# Patient Record
Sex: Female | Born: 1960 | Hispanic: No | Marital: Married | Smoking: Never smoker
Health system: Southern US, Community
[De-identification: ages and names within clinical notes are randomized; demographics above are authoritative.]

## PROBLEM LIST (undated history)

## (undated) DIAGNOSIS — K219 Gastro-esophageal reflux disease without esophagitis: Secondary | ICD-10-CM

## (undated) HISTORY — DX: Gastro-esophageal reflux disease without esophagitis: K21.9

## (undated) HISTORY — PX: APPENDECTOMY: SHX54

---

## 1999-03-14 ENCOUNTER — Other Ambulatory Visit: Admission: RE | Admit: 1999-03-14 | Discharge: 1999-03-14 | Payer: Self-pay | Admitting: Obstetrics & Gynecology

## 2000-05-10 ENCOUNTER — Other Ambulatory Visit: Admission: RE | Admit: 2000-05-10 | Discharge: 2000-05-10 | Payer: Self-pay | Admitting: Obstetrics & Gynecology

## 2001-06-25 ENCOUNTER — Other Ambulatory Visit: Admission: RE | Admit: 2001-06-25 | Discharge: 2001-06-25 | Payer: Self-pay | Admitting: Obstetrics & Gynecology

## 2002-08-25 ENCOUNTER — Other Ambulatory Visit: Admission: RE | Admit: 2002-08-25 | Discharge: 2002-08-25 | Payer: Self-pay | Admitting: Obstetrics & Gynecology

## 2003-08-31 ENCOUNTER — Other Ambulatory Visit: Admission: RE | Admit: 2003-08-31 | Discharge: 2003-08-31 | Payer: Self-pay | Admitting: Obstetrics & Gynecology

## 2004-08-30 ENCOUNTER — Other Ambulatory Visit: Admission: RE | Admit: 2004-08-30 | Discharge: 2004-08-30 | Payer: Self-pay | Admitting: Obstetrics & Gynecology

## 2005-12-26 ENCOUNTER — Ambulatory Visit: Payer: Self-pay | Admitting: Family Medicine

## 2010-11-24 ENCOUNTER — Encounter: Payer: Self-pay | Admitting: Internal Medicine

## 2010-11-27 ENCOUNTER — Other Ambulatory Visit: Payer: Self-pay | Admitting: Gastroenterology

## 2010-11-28 ENCOUNTER — Ambulatory Visit: Payer: Self-pay | Admitting: Internal Medicine

## 2010-12-07 ENCOUNTER — Encounter: Payer: Self-pay | Admitting: Internal Medicine

## 2010-12-18 ENCOUNTER — Encounter: Payer: Self-pay | Admitting: Internal Medicine

## 2010-12-18 ENCOUNTER — Ambulatory Visit (INDEPENDENT_AMBULATORY_CARE_PROVIDER_SITE_OTHER): Payer: 59 | Admitting: Internal Medicine

## 2010-12-18 DIAGNOSIS — J309 Allergic rhinitis, unspecified: Secondary | ICD-10-CM | POA: Insufficient documentation

## 2010-12-18 DIAGNOSIS — K219 Gastro-esophageal reflux disease without esophagitis: Secondary | ICD-10-CM

## 2010-12-18 DIAGNOSIS — Z1211 Encounter for screening for malignant neoplasm of colon: Secondary | ICD-10-CM

## 2010-12-18 DIAGNOSIS — R195 Other fecal abnormalities: Secondary | ICD-10-CM

## 2010-12-18 MED ORDER — PEG-KCL-NACL-NASULF-NA ASC-C 100 G PO SOLR: 1.0000 | Freq: Once | ORAL | Status: AC

## 2010-12-18 NOTE — Progress Notes (Signed)
Subjective:    Patient ID: Kellie Alexander, female    DOB: 06-09-1960, 50 y.o.   MRN: [id number]  HPI Kellie Alexander is a 50 year old female with a past medical history of allergic rhinitis and GERD who is seen in consultation at the request of Dr. Christell Constant for evaluation of diarrhea and heme positive stool.  The patient reports developing a diarrheal illness in mid October 2012. This lasted for the better part of 7 days. During this time she had multiple loose, watery stools a day. This also occurred at night. She also reports rectal pain and tenesmus. She saw her primary care provider after about 7 days of symptoms and was given a prescription for ciprofloxacin for 7 days. She reports that this dramatically improved her diarrhea, and her diarrhea subsequently resolved. She reports now she is having one formed brown stool daily. She has seen no rectal bleeding or melena. She also denies abdominal pain at present, and of note did not have significant abdominal pain during her diarrheal illness. She reports chronic heartburn which is well controlled with Prilosec daily. No dysphagia or odynophagia. No nausea or vomiting. No weight loss. Good appetite. No prior GI procedure.  Review of Systems Constitutional: Negative for fever, chills, night sweats, activity change, appetite change and unexpected weight change HEENT: Negative for sore throat, mouth sores and trouble swallowing. Eyes: Negative for visual disturbance Respiratory: Negative for cough, chest tightness and shortness of breath Cardiovascular: Negative for chest pain, palpitations and lower extremity swelling Gastrointestinal: See history of present illness Genitourinary: Negative for dysuria and hematuria. Musculoskeletal: Negative for back pain, arthralgias and myalgias Skin: Negative for rash or color change Neurological: Negative for headaches, weakness, numbness Hematological: Negative for adenopathy, negative for easy  bruising/bleeding Psychiatric/behavioral: Negative for depressed mood, negative for anxiety   Past Medical History  Diagnosis Date  . GERD (gastroesophageal reflux disease)   . Allergic rhinitis    Current Outpatient Prescriptions  Medication Sig Dispense Refill  . cetirizine (ZYRTEC) 10 MG tablet Take 10 mg by mouth daily.        Marland Kitchen levonorgestrel-ethinyl estradiol (ENPRESSE,TRIVORA) tablet Take 1 tablet by mouth daily.        Marland Kitchen omeprazole (PRILOSEC) 20 MG capsule Take 20 mg by mouth daily.        . peg 3350 powder (MOVIPREP) 100 G SOLR Take 1 kit (100 g total) by mouth once.  1 kit  0   Allergies  Allergen Reactions  . Singulair    Family History  Problem Relation Age of Onset  . Breast cancer Mother     Social History  . Marital Status: Married    Number of Children: 1   Occupational History  . Geologist, engineering    Social History Main Topics  . Smoking status: Never Smoker   . Smokeless tobacco: Never Used  . Alcohol Use: No  . Drug Use: No      Objective:   Physical Exam BP 126/72  Pulse 66  Ht 5\' 2"  (1.575 m)  Wt 195 lb (88.451 kg)  BMI 35.67 kg/m2  LMP 12/15/2010 Constitutional: Well-developed and well-nourished. No distress. HEENT: Normocephalic and atraumatic. Oropharynx is clear and moist. No oropharyngeal exudate. Conjunctivae are normal. Pupils are equal round and reactive to light. No scleral icterus. Neck: Neck supple. Trachea midline. Cardiovascular: Normal rate, regular rhythm and intact distal pulses. No M/R/G Pulmonary/chest: Effort normal and breath sounds normal. No wheezing, rales or rhonchi. Abdominal: Soft, nontender, nondistended. Bowel sounds active throughout.  There are no masses palpable. No hepatosplenomegaly. Extremities: no clubbing, cyanosis, or edema Lymphadenopathy: No cervical adenopathy noted. Neurological: Alert and oriented to person place and time. Skin: Skin is warm and dry. No rashes noted. Psychiatric: Normal mood and  affect. Behavior is normal.  Labs 11/22/2010 Fecal occult blood, immunohistochemical: Positive    Assessment & Plan:  50 year old female with a past medical history of allergic rhinitis and GERD who is seen in consultation at the request of Dr. Christell Constant for evaluation of diarrhea and heme positive stool.  1. Heme positive stool -- the patient's diarrhea has completely resolved and is consistent with either a viral or bacterial enteritis. She did have a heme positive stool around the time of this illness. She has never had a colonoscopy. I have recommended colonoscopy for her now based on her age and her heme positive stool. She is agreeable to proceed. We have discussed the test including the risk and benefits. She needs no other medication at present, given her diarrhea has fully resolved.  2. GERD -- her symptoms are well controlled on omeprazole daily, and she will continue with this therapy  Followup to be determined after procedure

## 2010-12-18 NOTE — Patient Instructions (Signed)
We have scheduled the colonoscopy with Dr. Erick Blinks. Directions and brochure provided. I have faxed the Moviprep ( colonoscopy prep)  To [facility]. I have given you the prescription that I faxed, in case the pharmacy didn't get the prescription.

## 2011-01-05 ENCOUNTER — Encounter: Payer: Self-pay | Admitting: Internal Medicine

## 2011-01-05 ENCOUNTER — Ambulatory Visit (AMBULATORY_SURGERY_CENTER): Payer: 59 | Admitting: Internal Medicine

## 2011-01-05 VITALS — BP 138/89 | HR 91 | Temp 98.9°F | Resp 24 | Ht 62.0 in | Wt 197.0 lb

## 2011-01-05 DIAGNOSIS — D129 Benign neoplasm of anus and anal canal: Secondary | ICD-10-CM

## 2011-01-05 DIAGNOSIS — Z1211 Encounter for screening for malignant neoplasm of colon: Secondary | ICD-10-CM

## 2011-01-05 DIAGNOSIS — D128 Benign neoplasm of rectum: Secondary | ICD-10-CM

## 2011-01-05 DIAGNOSIS — K5289 Other specified noninfective gastroenteritis and colitis: Secondary | ICD-10-CM

## 2011-01-05 DIAGNOSIS — D126 Benign neoplasm of colon, unspecified: Secondary | ICD-10-CM

## 2011-01-05 HISTORY — PX: COLONOSCOPY: SHX174

## 2011-01-05 MED ORDER — SODIUM CHLORIDE 0.9 % IV SOLN: 500.0000 mL | INTRAVENOUS | Status: DC

## 2011-01-05 NOTE — Progress Notes (Signed)
Pt tolerated the colonoscopy very well.  Pt intra procedure opening her eyes off add on.  She was asking questions also.  More sedation titrated per Dr. Lauro Franklin orders.  I asked the pt if she was comfortable, pt said "yes". maw

## 2011-01-05 NOTE — Progress Notes (Signed)
Patient did not experience any of the following events: a burn prior to discharge; a fall within the facility; wrong site/side/patient/procedure/implant event; or a hospital transfer or hospital admission upon discharge from the facility. (G8907) Patient did not have preoperative order for IV antibiotic SSI prophylaxis. (G8918)  

## 2011-01-05 NOTE — Patient Instructions (Signed)
Discharge instructions given with verbal understanding. Handout on polyps given. Avoid NSAIDS. Resume previous medications.

## 2011-01-08 ENCOUNTER — Telehealth: Payer: Self-pay

## 2011-01-08 NOTE — Telephone Encounter (Signed)
Left message

## 2011-01-17 ENCOUNTER — Encounter: Payer: Self-pay | Admitting: Internal Medicine

## 2011-06-05 ENCOUNTER — Encounter: Payer: Self-pay | Admitting: Internal Medicine

## 2011-08-07 ENCOUNTER — Encounter: Payer: Self-pay | Admitting: Internal Medicine

## 2011-08-23 ENCOUNTER — Ambulatory Visit (AMBULATORY_SURGERY_CENTER): Payer: 59 | Admitting: *Deleted

## 2011-08-23 VITALS — Ht 62.0 in | Wt 200.5 lb

## 2011-08-23 DIAGNOSIS — K633 Ulcer of intestine: Secondary | ICD-10-CM

## 2011-08-23 MED ORDER — NA SULFATE-K SULFATE-MG SULF 17.5-3.13-1.6 GM/177ML PO SOLN: ORAL | Status: DC

## 2011-09-06 ENCOUNTER — Encounter: Payer: Self-pay | Admitting: Internal Medicine

## 2011-09-06 ENCOUNTER — Ambulatory Visit (AMBULATORY_SURGERY_CENTER): Payer: 59 | Admitting: Internal Medicine

## 2011-09-06 VITALS — BP 127/73 | HR 83 | Temp 99.1°F | Resp 15 | Ht 62.0 in | Wt 200.0 lb

## 2011-09-06 DIAGNOSIS — K633 Ulcer of intestine: Secondary | ICD-10-CM

## 2011-09-06 DIAGNOSIS — K5730 Diverticulosis of large intestine without perforation or abscess without bleeding: Secondary | ICD-10-CM

## 2011-09-06 MED ORDER — SODIUM CHLORIDE 0.9 % IV SOLN: 500.0000 mL | INTRAVENOUS | Status: DC

## 2011-09-06 NOTE — Op Note (Signed)
[facility] [address] [place], [place]  [address]  COLONOSCOPY PROCEDURE REPORT  PATIENT:  Kellie, Alexander  MR#:  [id number] BIRTHDATE:  July 10, 1960, 50 yrs. old  GENDER:  female ENDOSCOPIST:  Carie Caddy. Kylar Speelman, MD  PROCEDURE DATE:  09/06/2011 PROCEDURE:  Diagnostic Colonoscopy ASA CLASS:  Class II INDICATIONS:  follow-up of colonic ulcer seen on colonoscopy in Nov 2012 MEDICATIONS:   MAC sedation, administered by CRNA, propofol (Diprivan) 150 mg IV  DESCRIPTION OF PROCEDURE:   After the risks benefits and alternatives of the procedure were thoroughly explained, informed consent was obtained.  Digital rectal exam was performed and revealed no rectal masses.   The LB CF-[id number] endoscope was introduced through the anus and advanced to the terminal ileum which was intubated for a short distance, without limitations. The quality of the prep was excellent, using MoviPrep.  The instrument was then slowly withdrawn as the colon was fully examined. <<PROCEDUREIMAGES>> FINDINGS:  The terminal ileum appeared normal.  A normal appearing cecum, ileocecal valve, and appendiceal orifice were identified. The ascending, hepatic flexure, transverse, splenic flexure, descending, sigmoid colon, and rectum appeared unremarkable.  The previously seen ulceration at the IC valve has healed completely. A diverticulum was found in the sigmoid colon.   Retroflexed views in the rectum revealed no abnormalities.    The scope was then withdrawn from the cecum and the procedure completed .  COMPLICATIONS:  None  ENDOSCOPIC IMPRESSION: 1) Normal terminal ileum 2) Normal colon.  Healed colonic ulcer. 3) Mild diverticulosis in the sigmoid colon  RECOMMENDATIONS: 1) High fiber diet. 2) You should continue to follow colorectal cancer screening guidelines for "routine risk" patients with a repeat colonoscopy in 10 years. There is no need for FOBT (stool) testing for at least 5  years.  Carie Caddy. Rhea Belton, MD  CC:  The Patient Rudi Heap, MD  n. Rosalie DoctorCarie Caddy. Morio Widen at 09/06/2011 02:26 PM  Rande Lawman, [phone]

## 2011-09-06 NOTE — Progress Notes (Signed)
Patient did not experience any of the following events: a burn prior to discharge; a fall within the facility; wrong site/side/patient/procedure/implant event; or a hospital transfer or hospital admission upon discharge from the facility. (G8907) Patient did not have preoperative order for IV antibiotic SSI prophylaxis. (G8918)  

## 2011-09-06 NOTE — Patient Instructions (Addendum)
Handouts were given to your care partner on diverticulosis and high fiber diet.  You may resume your medications today.  Please call if any questions or concerns.      YOU HAD AN ENDOSCOPIC PROCEDURE TODAY AT THE [facility]: Refer to the procedure report that was given to you for any specific questions about what was found during the examination.  If the procedure report does not answer your questions, please call your gastroenterologist to clarify.  If you requested that your care partner not be given the details of your procedure findings, then the procedure report has been included in a sealed envelope for you to review at your convenience later.  YOU SHOULD EXPECT: Some feelings of bloating in the abdomen. Passage of more gas than usual.  Walking can help get rid of the air that was put into your GI tract during the procedure and reduce the bloating. If you had a lower endoscopy (such as a colonoscopy or flexible sigmoidoscopy) you may notice spotting of blood in your stool or on the toilet paper. If you underwent a bowel prep for your procedure, then you may not have a normal bowel movement for a few days.  DIET: Your first meal following the procedure should be a light meal and then it is ok to progress to your normal diet.  A half-sandwich or bowl of soup is an example of a good first meal.  Heavy or fried foods are harder to digest and may make you feel nauseous or bloated.  Likewise meals heavy in dairy and vegetables can cause extra gas to form and this can also increase the bloating.  Drink plenty of fluids but you should avoid alcoholic beverages for 24 hours.  ACTIVITY: Your care partner should take you home directly after the procedure.  You should plan to take it easy, moving slowly for the rest of the day.  You can resume normal activity the day after the procedure however you should NOT DRIVE or use heavy machinery for 24 hours (because of the sedation medicines used during  the test).    SYMPTOMS TO REPORT IMMEDIATELY: A gastroenterologist can be reached at any hour.  During normal business hours, 8:30 AM to 5:00 PM Monday through Friday, call [phone].  After hours and on weekends, please call the GI answering service at [phone] who will take a message and have the physician on call contact you.   Following lower endoscopy (colonoscopy or flexible sigmoidoscopy):  Excessive amounts of blood in the stool  Significant tenderness or worsening of abdominal pains  Swelling of the abdomen that is new, acute  Fever of 100F or higher    FOLLOW UP: If any biopsies were taken you will be contacted by phone or by letter within the next 1-3 weeks.  Call your gastroenterologist if you have not heard about the biopsies in 3 weeks.  Our staff will call the home number listed on your records the next business day following your procedure to check on you and address any questions or concerns that you may have at that time regarding the information given to you following your procedure. This is a courtesy call and so if there is no answer at the home number and we have not heard from you through the emergency physician on call, we will assume that you have returned to your regular daily activities without incident.  SIGNATURES/CONFIDENTIALITY: You and/or your care partner have signed paperwork which will be entered into  your electronic medical record.  These signatures attest to the fact that that the information above on your After Visit Summary has been reviewed and is understood.  Full responsibility of the confidentiality of this discharge information lies with you and/or your care-partner.

## 2011-09-06 NOTE — Progress Notes (Signed)
No complaints noted in the recovery room. Maw   

## 2011-09-06 NOTE — Progress Notes (Signed)
Pt to the restroom before discharge. Maw   

## 2011-09-07 ENCOUNTER — Telehealth: Payer: Self-pay | Admitting: *Deleted

## 2011-09-07 NOTE — Telephone Encounter (Signed)
  Follow up Call-  Call back number 09/06/2011 01/05/2011  Post procedure Call Back phone  # [phone]---ok to leave message  Permission to leave phone message Yes -     Patient questions:  Do you have a fever, pain , or abdominal swelling? no Pain Score  0 *  Have you tolerated food without any problems? yes  Have you been able to return to your normal activities? yes  Do you have any questions about your discharge instructions: Diet   no Medications  no Follow up visit  no  Do you have questions or concerns about your Care? no  Actions: * If pain score is 4 or above: No action needed, pain <4.

## 2013-03-18 ENCOUNTER — Other Ambulatory Visit: Payer: Self-pay

## 2014-11-29 ENCOUNTER — Ambulatory Visit (INDEPENDENT_AMBULATORY_CARE_PROVIDER_SITE_OTHER): Payer: BC Managed Care – PPO

## 2014-11-29 ENCOUNTER — Encounter: Payer: Self-pay | Admitting: Family Medicine

## 2014-11-29 ENCOUNTER — Other Ambulatory Visit: Payer: Self-pay | Admitting: Family Medicine

## 2014-11-29 ENCOUNTER — Ambulatory Visit (INDEPENDENT_AMBULATORY_CARE_PROVIDER_SITE_OTHER): Payer: BC Managed Care – PPO | Admitting: Family Medicine

## 2014-11-29 VITALS — BP 154/98 | HR 104 | Temp 97.9°F | Ht 62.0 in | Wt 200.0 lb

## 2014-11-29 DIAGNOSIS — W01198A Fall on same level from slipping, tripping and stumbling with subsequent striking against other object, initial encounter: Secondary | ICD-10-CM | POA: Diagnosis not present

## 2014-11-29 DIAGNOSIS — S93402A Sprain of unspecified ligament of left ankle, initial encounter: Secondary | ICD-10-CM

## 2014-11-29 DIAGNOSIS — M79605 Pain in left leg: Secondary | ICD-10-CM

## 2014-11-29 DIAGNOSIS — M25572 Pain in left ankle and joints of left foot: Secondary | ICD-10-CM

## 2014-11-29 DIAGNOSIS — W19XXXA Unspecified fall, initial encounter: Secondary | ICD-10-CM

## 2014-11-29 NOTE — Patient Instructions (Addendum)
Ice for 48 hours then warm wet compresses Use the Ace bandage Use crutches for the next 2-3 days Take ibuprofen 2 twice daily after breakfast and 2 after dinner

## 2014-11-29 NOTE — Progress Notes (Signed)
   Subjective:    Patient ID: Kellie Alexander, female    DOB: Oct 15, 1960, 54 y.o.   MRN: [id number]  HPI Patient fell yesterday around 6pm and twisted left ankle. The asphalt and gravel were not at the same level and the patient did not see this and tripped because she was crossing the interface between the 2.  Review of Systems  Constitutional: Negative.   HENT: Negative.   Eyes: Negative.   Respiratory: Negative.   Cardiovascular: Negative.   Gastrointestinal: Negative.   Endocrine: Negative.   Genitourinary: Negative.   Musculoskeletal: Positive for joint swelling and arthralgias.  Neurological: Negative.   Hematological: Negative.   Psychiatric/Behavioral: Negative.        Objective:   Physical Exam  Constitutional: She is oriented to person, place, and time. She appears well-developed and well-nourished. No distress.  HENT:  Head: Normocephalic.  Musculoskeletal: She exhibits edema and tenderness.  Range of motion with dorsiflexion is somewhat limited because of pain and swelling in the left lateral malleolus and the left proximal foot. There is only slight tenderness to palpation.  Neurological: She is alert and oriented to person, place, and time.  Skin: Skin is warm and dry. No rash noted. No erythema.  Psychiatric: She has a normal mood and affect. Her behavior is normal. Judgment and thought content normal.  Nursing note and vitals reviewed.   BP 154/98 mmHg  Pulse 104  Temp(Src) 97.9 F (36.6 C) (Oral)  Ht 5\' 2"  (1.575 m)  Wt 200 lb (90.719 kg)  BMI 36.57 kg/m2  [facility] reading (PRIMARY) by  Dr.Moore-left foot--- no sign of a fracture                                      Assessment & Plan:  1. Fall, initial encounter  2. Left lateral ankle pain  3. Pain of left lower extremity  4. 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 Elevated blood pressure -Watch sodium intake  Patient Instructions  Ice for 48 hours then warm wet compresses Use the Ace bandage Use crutches for the next 2-3 days Take ibuprofen 2 twice daily after breakfast and 2 after dinner   Recheck blood pressure at next visit Arrie Senate MD   .

## 2014-11-30 ENCOUNTER — Ambulatory Visit: Payer: BC Managed Care – PPO | Admitting: Family Medicine

## 2014-12-07 ENCOUNTER — Encounter: Payer: Self-pay | Admitting: *Deleted

## 2014-12-07 ENCOUNTER — Telehealth: Payer: Self-pay | Admitting: Family Medicine

## 2014-12-07 ENCOUNTER — Encounter: Payer: Self-pay | Admitting: Family Medicine

## 2014-12-07 ENCOUNTER — Ambulatory Visit (INDEPENDENT_AMBULATORY_CARE_PROVIDER_SITE_OTHER): Payer: BC Managed Care – PPO | Admitting: Family Medicine

## 2014-12-07 VITALS — BP 139/87 | HR 98 | Temp 98.1°F | Ht 62.0 in | Wt 214.0 lb

## 2014-12-07 DIAGNOSIS — M25572 Pain in left ankle and joints of left foot: Secondary | ICD-10-CM

## 2014-12-07 DIAGNOSIS — S93402D Sprain of unspecified ligament of left ankle, subsequent encounter: Secondary | ICD-10-CM

## 2014-12-07 NOTE — Telephone Encounter (Signed)
Patient aware that work notes have been faxed to central office.

## 2014-12-07 NOTE — Patient Instructions (Signed)
The patient should continue with warm wet compresses 20 minutes 3 or 4 times daily She should be using the Ace bandage with a shoe or wearing a tennis shoe that gives her good support She should do range of motion exercises She should continue with the NSAID at least 3 times daily after eating as long as it is not bothering her stomach. She should remain out of work until she is rechecked in about 10 days. 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

## 2014-12-07 NOTE — Progress Notes (Signed)
Subjective:    Patient ID: Kellie Alexander, female    DOB: 1960-06-28, 54 y.o.   MRN: [id number]  HPI Patient here today for 1 week follow up on left ankle / foot pain from a fall. The ankle is still sore especially with weightbearing. She is using a cane today. The x-rays were negative and a copy of the report was given to the patient today. 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      Patient Active Problem List   Diagnosis Date Noted  . Allergic rhinitis 12/18/2010  . GERD (gastroesophageal reflux disease) 12/18/2010  . Heme positive stool 12/18/2010   Outpatient Encounter Prescriptions as of 12/07/2014  Medication Sig  . cetirizine (ZYRTEC) 10 MG tablet Take 10 mg by mouth daily.    . Multiple Vitamins-Minerals (MULTIVITAMIN PO) Take by mouth daily.  Marland Kitchen omeprazole (PRILOSEC) 20 MG capsule Take 20 mg by mouth daily.    . [DISCONTINUED] levonorgestrel-ethinyl estradiol (ENPRESSE,TRIVORA) tablet Take 1 tablet by mouth daily.     No facility-administered encounter medications on file as of 12/07/2014.      Review of Systems  Constitutional: Negative.   HENT: Negative.   Eyes: Negative.   Respiratory: Negative.   Cardiovascular: Negative.   Gastrointestinal: Negative.   Endocrine: Negative.   Genitourinary: Negative.   Musculoskeletal: Positive for arthralgias (left ankle and foot pain).  Skin: Negative.   Allergic/Immunologic: Negative.   Neurological: Negative.   Hematological: Negative.   Psychiatric/Behavioral: Negative.        Objective:   Physical Exam  Constitutional: She is oriented to person, place, and time. She appears well-developed and well-nourished.  Pleasant and alert  Musculoskeletal: She exhibits edema and tenderness.  The patient does have increased mobility of the ankle. There is no rubor and there is only minimal swelling. With  mobility there is still some discomfort.  Neurological: She is alert and oriented to person, place, and time.  Skin: Skin is warm and dry. No rash noted. No erythema.  Psychiatric: She has a normal mood and affect. Her behavior is normal. Judgment and thought content normal.  Nursing note and vitals reviewed.  BP 139/87 mmHg  Pulse 98  Temp(Src) 98.1 F (36.7 C) (Oral)  Ht 5\' 2"  (1.575 m)  Wt 214 lb (97.07 kg)  BMI 39.13 kg/m2  LMP 05/07/2014        Assessment & Plan:  1. Left lateral ankle pain -This is improved but is still painful  2. Left ankle sprain, subsequent encounter -This is improving but still painful -She will begin range of motion exercises and will continue taking the ibuprofen -She will use warm wet compresses 20 minutes 3 or 4 times daily -She will wear support whether it is the Ace bandage or good lace up tennis shoe. -She will remain out of work until she is rechecked in about 10 days.  Patient Instructions  The patient should continue with warm wet compresses 20 minutes 3 or 4 times daily She should be using the Ace bandage with a shoe or wearing a tennis shoe that gives her good support She should do range of motion exercises She should continue with the NSAID at least 3 times daily after eating as long as it is not bothering her stomach. She should remain out of work until she  is rechecked in about 10 days. 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

## 2014-12-17 ENCOUNTER — Encounter: Payer: Self-pay | Admitting: Family Medicine

## 2014-12-17 ENCOUNTER — Encounter: Payer: Self-pay | Admitting: *Deleted

## 2014-12-17 ENCOUNTER — Telehealth: Payer: Self-pay | Admitting: Family Medicine

## 2014-12-17 ENCOUNTER — Ambulatory Visit (INDEPENDENT_AMBULATORY_CARE_PROVIDER_SITE_OTHER): Payer: BC Managed Care – PPO | Admitting: Family Medicine

## 2014-12-17 VITALS — BP 151/82 | HR 107 | Temp 97.5°F | Ht 62.0 in | Wt 221.0 lb

## 2014-12-17 DIAGNOSIS — S93602D Unspecified sprain of left foot, subsequent encounter: Secondary | ICD-10-CM

## 2014-12-17 DIAGNOSIS — M25572 Pain in left ankle and joints of left foot: Secondary | ICD-10-CM

## 2014-12-17 DIAGNOSIS — W19XXXD Unspecified fall, subsequent encounter: Secondary | ICD-10-CM

## 2014-12-17 NOTE — Patient Instructions (Addendum)
Foot is still painful and bothersome and especially after she's been on her feet for any period of time. She is beginning to bear more weight. The x-ray was negative. We will arrange for her to have some physical therapy and she will remain out of work until she is rechecked in the next 10-14 days. Continue with ibuprofen 2 tablets or 400 mg twice daily after breakfast and supper Continue with good supportive shoes and or Ace bandage and the use of warm wet compresses 20 minutes 3 or 4 times daily with range of motion exercises

## 2014-12-17 NOTE — Progress Notes (Signed)
   Subjective:    Patient ID: Kellie Alexander, female    DOB: 30-Apr-1960, 54 y.o.   MRN: [id number]  HPI Patient here today for 10 day follow up on left ankle pain. She is doing better and now able to bear some weight on ankle.      Patient Active Problem List   Diagnosis Date Noted  . Allergic rhinitis 12/18/2010  . GERD (gastroesophageal reflux disease) 12/18/2010  . Heme positive stool 12/18/2010   Outpatient Encounter Prescriptions as of 12/17/2014  Medication Sig  . cetirizine (ZYRTEC) 10 MG tablet Take 10 mg by mouth daily.    . Multiple Vitamins-Minerals (MULTIVITAMIN PO) Take by mouth daily.  Marland Kitchen omeprazole (PRILOSEC) 20 MG capsule Take 20 mg by mouth daily.     No facility-administered encounter medications on file as of 12/17/2014.      Review of Systems  Constitutional: Negative.   HENT: Negative.   Eyes: Negative.   Respiratory: Negative.   Cardiovascular: Negative.   Gastrointestinal: Negative.   Endocrine: Negative.   Genitourinary: Negative.   Musculoskeletal: Positive for arthralgias (left ankle pain).  Skin: Negative.   Allergic/Immunologic: Negative.   Neurological: Negative.   Hematological: Negative.   Psychiatric/Behavioral: Negative.        Objective:   Physical Exam  Constitutional: She is oriented to person, place, and time. She appears well-developed and well-nourished. No distress.  Musculoskeletal: She exhibits edema and tenderness.  The range of motion of the left foot has improved and there is less swelling. There is still tenderness on the left lateral aspect of the foot and lower ankle. There has been improvement with mobility but there is still pain and tenderness and swelling especially after being on the foot for. Of time.  Neurological: She is alert and oriented to person, place, and time.  Skin: No rash noted.  Psychiatric: She has a normal mood and affect. Her behavior is normal. Judgment and thought content normal.  Nursing note  and vitals reviewed.    BP 151/82 mmHg  Pulse 107  Temp(Src) 97.5 F (36.4 C) (Oral)  Ht 5\' 2"  (1.575 m)  Wt 221 lb (100.245 kg)  BMI 40.41 kg/m2  LMP 05/07/2014      Assessment & Plan:  1. Foot sprain, left, subsequent encounter -The foot sprain is improving and the patient needs a little more time in order to be able to fulfill her job expectations and we will see her back and ask her to remain out of work until we see her back in at the same time schedule her for some physical therapy to improve and speed up her heel  2. Fall, subsequent encounter -No more falls.  3. Left lateral ankle pain -Continue with physical therapy and anti-inflammatory medicines remain out of work as planned  Patient Instructions  Foot is still painful and bothersome and especially after she's been on her feet for any period of time. She is beginning to bear more weight. The x-ray was negative. We will arrange for her to have some physical therapy and she will remain out of work until she is rechecked in the next 10-14 days. Continue with ibuprofen 2 tablets or 400 mg twice daily after breakfast and supper Continue with good supportive shoes and or Ace bandage and the use of warm wet compresses 20 minutes 3 or 4 times daily with range of motion exercises   Arrie Senate MD

## 2014-12-17 NOTE — Addendum Note (Signed)
Addended by: Zannie Cove on: 12/17/2014 12:28 PM   Modules accepted: Orders

## 2014-12-17 NOTE — Telephone Encounter (Signed)
Work note faxed through [employer] to [employer] to [phone].

## 2014-12-27 ENCOUNTER — Ambulatory Visit (INDEPENDENT_AMBULATORY_CARE_PROVIDER_SITE_OTHER): Payer: BC Managed Care – PPO | Admitting: Family Medicine

## 2014-12-27 ENCOUNTER — Encounter: Payer: Self-pay | Admitting: *Deleted

## 2014-12-27 ENCOUNTER — Encounter: Payer: Self-pay | Admitting: Family Medicine

## 2014-12-27 VITALS — BP 143/95 | HR 103 | Temp 96.9°F | Ht 62.0 in | Wt 224.0 lb

## 2014-12-27 DIAGNOSIS — S93602D Unspecified sprain of left foot, subsequent encounter: Secondary | ICD-10-CM | POA: Diagnosis not present

## 2014-12-27 NOTE — Patient Instructions (Signed)
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 Continue ibuprofen as needed once or twice daily Watch sodium intake

## 2014-12-27 NOTE — Progress Notes (Signed)
   Subjective:    Patient ID: Kellie Alexander, female    DOB: 1960/08/16, 54 y.o.   MRN: [id number]  HPI Patient here today for follow up on left ankle pain. This is a 10 day recheck from a left ankle sprain. She is feeling better. She is going to get some physical therapy for this starting tomorrow.     Patient Active Problem List   Diagnosis Date Noted  . Allergic rhinitis 12/18/2010  . GERD (gastroesophageal reflux disease) 12/18/2010  . Heme positive stool 12/18/2010   Outpatient Encounter Prescriptions as of 12/27/2014  Medication Sig  . cetirizine (ZYRTEC) 10 MG tablet Take 10 mg by mouth daily.    . Multiple Vitamins-Minerals (MULTIVITAMIN PO) Take by mouth daily.  Marland Kitchen omeprazole (PRILOSEC) 20 MG capsule Take 20 mg by mouth daily.     No facility-administered encounter medications on file as of 12/27/2014.      Review of Systems  Constitutional: Negative.   HENT: Negative.   Eyes: Negative.   Respiratory: Negative.   Cardiovascular: Negative.   Gastrointestinal: Negative.   Endocrine: Negative.   Genitourinary: Negative.   Musculoskeletal: Positive for arthralgias (left ankle pain - better).  Skin: Negative.   Allergic/Immunologic: Negative.   Neurological: Negative.   Hematological: Negative.   Psychiatric/Behavioral: Negative.        Objective:   Physical Exam  Constitutional: She is oriented to person, place, and time.  Musculoskeletal: Normal range of motion. She exhibits tenderness. She exhibits no edema.  The left ankle is much improved and much less swelling. 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  Skin: Skin is warm and dry. No erythema.  Psychiatric: She has a normal mood and affect. Her behavior is normal. Judgment and thought  content normal.  Nursing note and vitals reviewed.   BP 143/95 mmHg  Pulse 103  Temp(Src) 96.9 F (36.1 C) (Oral)  Ht 5\' 2"  (1.575 m)  Wt 224 lb (101.606 kg)  BMI 40.96 kg/m2  LMP 05/07/2014       Assessment & Plan:  1. 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 Continue ibuprofen as needed once or twice daily Watch sodium intake   Arrie Senate MD

## 2014-12-28 ENCOUNTER — Ambulatory Visit: Payer: BC Managed Care – PPO | Attending: Family Medicine | Admitting: Physical Therapy

## 2014-12-28 DIAGNOSIS — M25572 Pain in left ankle and joints of left foot: Secondary | ICD-10-CM | POA: Diagnosis not present

## 2014-12-28 DIAGNOSIS — M25672 Stiffness of left ankle, not elsewhere classified: Secondary | ICD-10-CM | POA: Diagnosis present

## 2014-12-28 DIAGNOSIS — R2681 Unsteadiness on feet: Secondary | ICD-10-CM | POA: Diagnosis present

## 2014-12-28 NOTE — Patient Instructions (Signed)
  Ankle Alphabet   Using left ankle and foot only, trace the letters of the alphabet. Perform A to Z. Repeat _1___ times per set. Do ____ sets per session. Do __2-3__ sessions per day.  http://orth.exer.us/16   Copyright  VHI. All rights reserved.    Ankle Circles   Slowly rotate right foot and ankle clockwise then counterclockwise. Gradually increase range of motion. Avoid pain. Circle __10__ times each direction per set. Do ____ sets per session. Do 2-3____ sessions per day.  http://orth.exer.us/30   Copyright  VHI. All rights reserved.    ROM: Plantar / Dorsiflexion   With left leg relaxed, gently flex and extend ankle. Move through full range of motion. Avoid pain. Repeat __20__ times per set. Do ____ sets per session. Do __3-4__ sessions per day.  http://orth.exer.us/34   Copyright  VHI. All rights reserved.    ROM: Inversion / Eversion   With left leg relaxed, gently turn ankle and foot in and out. Move through full range of motion. Avoid pain. Repeat _20___ times per set. Do ____ sets per session. Do _3-4___ sessions per day.  http://orth.exer.us/36   Copyright  VHI. All rights reserved.   Heel Raises    Stand with support. Tighten pelvic floor and hold. With knees straight, raise heels off ground. Hold 2-3 seconds. Repeat _10-30__ times. Do _1__ times a day.  Copyright  VHI. All rights reserved.   Achilles Tendon Stretch    Stand with hands supported on wall, elbows slightly bent, feet parallel and both heels on floor, front knee bent, back knee straight. Slowly relax back knee until a stretch is felt in achilles tendon. Hold _30___ seconds. Repeat with leg positions switched.    Then bend back knee slightly and hold 30 seconds.   Do 3 x each. 2x/day.  Copyright  VHI. All rights reserved.   Madelyn Flavors, PT 12/28/2014 1:34 PM [facility] [place], [place], [address] Phone:  [phone]   Fax:  [phone]

## 2014-12-28 NOTE — Therapy (Addendum)
[facility] [place], [place], [address] Phone: [phone]   Fax:  [phone]  Physical Therapy Evaluation  Patient Details  Name: Kellie Alexander MRN: [id number] Date of Birth: 1960/09/28 Referring Provider: Redge Gainer MD  Encounter Date: 12/28/2014      PT End of Session - 12/28/14 1308    Visit Number 1   Number of Visits 8   Date for PT Re-Evaluation 01/25/15   PT Start Time 9509   PT Stop Time 1335   PT Time Calculation (min) 29 min   Activity Tolerance Patient tolerated treatment well   Behavior During Therapy [facility] for tasks assessed/performed      Past Medical History  Diagnosis Date  . GERD (gastroesophageal reflux disease)   . Allergic rhinitis     Past Surgical History  Procedure Laterality Date  . Appendectomy    . Colonoscopy  01/05/2011    hyperplastic polyps; ulcer ileocecal valve/Dr. Hilarie Fredrickson    There were no vitals filed for this visit.  Visit Diagnosis:  Left ankle pain - Plan: PT plan of care cert/re-cert  Stiffness of left ankle joint - Plan: PT plan of care cert/re-cert  Unsteadiness - Plan: PT plan of care cert/re-cert      Subjective Assessment - 12/28/14 1308    Subjective Patient rolled her ankle 11/27/14 when walking across uneven ground. She states she is much better overall. Hopes to RTW 01/03/15.   Patient Stated Goals get ankle stronger and get rid of pain   Currently in Pain? Yes   Pain Score 2    Pain Location Ankle   Pain Orientation Left;Lateral   Pain Descriptors / Indicators Aching   Pain Type Acute pain   Pain Onset 1 to 4 weeks ago   Pain Frequency Intermittent   Aggravating Factors  walking on uneven ground   Pain Relieving Factors ibuprofen            [facility] PT Assessment - 12/28/14 0001    Assessment   Medical Diagnosis L foot sprain, L lateral ankle pain   Referring Provider Redge Gainer MD   Onset Date/Surgical Date 11/27/14   Next MD Visit  01/03/15   Balance Screen   Has the patient fallen in the past 6 months Yes   How many times? 1   Has the patient had a decrease in activity level because of a fear of falling?  No   Is the patient reluctant to leave their home because of a fear of falling?  No   ROM / Strength   AROM / PROM / Strength AROM;Strength   AROM   AROM Assessment Site Ankle   Right/Left Ankle Left   Left Ankle Dorsiflexion 0  5 passive   Left Ankle Plantar Flexion 40  62 passive   Left Ankle Inversion 34  34 passive   Left Ankle Eversion 20  30 passive   Strength   Strength Assessment Site Ankle   Right/Left Ankle Left   Left Ankle Dorsiflexion 5/5   Left Ankle Plantar Flexion 4/5   Left Ankle Inversion 4+/5   Left Ankle Eversion 4/5   Palpation   Palpation comment tenderness of L plantar fascia at cuboig; L lateral foot and along post tib tendon; anterior talus.   Ambulation/Gait   Gait Comments mild antalgic gait    Balance   Balance Assessed --  SLS <3 seconds on LLE  PT Education - 12/28/14 1338    Education provided Yes   Education Details HEP   Person(s) Educated Patient   Methods Explanation;Demonstration;Handout   Comprehension Verbalized understanding;Returned demonstration             PT Long Term Goals - 12/28/14 1357    PT LONG TERM GOAL #1   Title I with HEP   Time 4   Period Weeks   Status New   PT LONG TERM GOAL #2   Title L ankle strength 5/5   Time 4   Period Weeks   Status New   PT LONG TERM GOAL #3   Title able to ambulate with nonantalgic gait pattern   Time 4   Period Weeks   Status New   PT LONG TERM GOAL #4   Title Able to perform ADLS with 7/91 pain or less   Time 4   Period Weeks   Status New   PT LONG TERM GOAL #5   Title improved SLS on LLE to 10 seconds   Time 4   Period Weeks   Status New               Plan - 12/28/14 1342    Clinical Impression Statement Patient presents with c/o  L ankle pain after an inversion sprain on 11/27/14. She walks with a slightly antalgic gait and reports she does not feel confident on stairs. She has decreased ROM, strength and balance. She also reports intermittent swelling.   Pt will benefit from skilled therapeutic intervention in order to improve on the following deficits Abnormal gait;Decreased range of motion;Pain;Decreased balance;Decreased strength;Decreased activity tolerance   Rehab Potential Excellent   PT Frequency 2x / week   PT Duration 4 weeks   PT Treatment/Interventions ADLs/Self Care Home Management;Electrical Stimulation;Cryotherapy;Ultrasound;Gait training;Stair training;Manual techniques;Therapeutic exercise;Vasopneumatic Device;Passive range of motion;Patient/family education;Neuromuscular re-education;Balance training   PT Next Visit Plan US/STW to L plantar fascia prn; balance, ankle strengthening, stairs, gait.   PT Home Exercise Plan ankle ROM, heel raises, gastroc/soleus stretch   Consulted and Agree with Plan of Care Patient         Problem List Patient Active Problem List   Diagnosis Date Noted  . Allergic rhinitis 12/18/2010  . GERD (gastroesophageal reflux disease) 12/18/2010  . Heme positive stool 12/18/2010   Madelyn Flavors PT  12/28/2014, 2:04 PM  [facility] [address] [place], [place], [address] Phone: [phone]   Fax:  [phone]  Name: Kellie Alexander MRN: [id number] Date of Birth: 1960/09/04  PHYSICAL THERAPY DISCHARGE SUMMARY  Visits from Start of Care: 1.  Current functional level related to goals / functional outcomes: See above.   Remaining deficits: See below.   Education / Equipment:  Plan: Patient agrees to discharge.  Patient goals were not met. Patient is being discharged due to not returning since the last visit.  ?????         Mali Applegate MPT

## 2015-01-03 ENCOUNTER — Encounter: Payer: Self-pay | Admitting: Family Medicine

## 2015-01-03 ENCOUNTER — Encounter (INDEPENDENT_AMBULATORY_CARE_PROVIDER_SITE_OTHER): Payer: Self-pay

## 2015-01-03 ENCOUNTER — Ambulatory Visit (INDEPENDENT_AMBULATORY_CARE_PROVIDER_SITE_OTHER): Payer: BC Managed Care – PPO | Admitting: Family Medicine

## 2015-01-03 VITALS — BP 128/81 | HR 97 | Temp 97.0°F | Ht 62.0 in | Wt 223.0 lb

## 2015-01-03 DIAGNOSIS — S93602D Unspecified sprain of left foot, subsequent encounter: Secondary | ICD-10-CM

## 2015-01-03 NOTE — Progress Notes (Signed)
   Subjective:    Patient ID: Kellie Alexander, female    DOB: 10/28/1960, 54 y.o.   MRN: [id number]  HPI Patient here today for follow up on left ankle. She went back to work today and now has some slight swelling. She is doing better and seems to be pleased with her results and her physical therapy.      Patient Active Problem List   Diagnosis Date Noted  . Allergic rhinitis 12/18/2010  . GERD (gastroesophageal reflux disease) 12/18/2010  . Heme positive stool 12/18/2010   Outpatient Encounter Prescriptions as of 01/03/2015  Medication Sig  . cetirizine (ZYRTEC) 10 MG tablet Take 10 mg by mouth daily.    . Multiple Vitamins-Minerals (MULTIVITAMIN PO) Take by mouth daily.  Marland Kitchen omeprazole (PRILOSEC) 20 MG capsule Take 20 mg by mouth daily.     No facility-administered encounter medications on file as of 01/03/2015.      Review of Systems  Constitutional: Negative.   HENT: Negative.   Eyes: Negative.   Respiratory: Negative.   Gastrointestinal: Negative.   Endocrine: Negative.   Genitourinary: Negative.   Musculoskeletal: Positive for arthralgias (some swelling - left ankle).  Skin: Negative.   Allergic/Immunologic: Negative.   Neurological: Negative.   Hematological: Negative.   Psychiatric/Behavioral: Negative.        Objective:   Physical Exam  BP 128/81 mmHg  Pulse 97  Temp(Src) 97 F (36.1 C) (Oral)  Ht 5\' 2"  (1.575 m)  Wt 223 lb (101.152 kg)  BMI 40.78 kg/m2  LMP 05/07/2014 The left ankle is much improved. There is no apparent swelling or edema. There is no rubor or erythema. There is good mobility both flexion and extension and from left to right movements. There is no pain with these movements.      Assessment & Plan:  1. Foot sprain, left, subsequent encounter -The patient has is improved and will be released to return to work. She will use the Ace bandage for another week or so and continue to take ibuprofen as needed. -She will not be given a return  appointment.  Patient Instructions  Continue with range of motion exercises and ibuprofen Use Ace bandage for the next week or so especially at work After another week discontinued Ace bandage and gradually taper off ibuprofen   Arrie Senate MD

## 2015-01-03 NOTE — Patient Instructions (Signed)
Continue with range of motion exercises and ibuprofen Use Ace bandage for the next week or so especially at work After another week discontinued Ace bandage and gradually taper off ibuprofen

## 2015-01-06 ENCOUNTER — Encounter: Payer: BC Managed Care – PPO | Admitting: Physical Therapy

## 2015-03-23 ENCOUNTER — Telehealth: Payer: Self-pay | Admitting: Family Medicine

## 2015-03-30 ENCOUNTER — Encounter: Payer: Self-pay | Admitting: Pediatrics

## 2015-03-30 ENCOUNTER — Ambulatory Visit (INDEPENDENT_AMBULATORY_CARE_PROVIDER_SITE_OTHER): Payer: BC Managed Care – PPO | Admitting: Pediatrics

## 2015-03-30 VITALS — BP 156/94 | HR 106 | Temp 98.1°F | Ht 62.0 in | Wt 225.0 lb

## 2015-03-30 DIAGNOSIS — H8121 Vestibular neuronitis, right ear: Secondary | ICD-10-CM

## 2015-03-30 DIAGNOSIS — H6501 Acute serous otitis media, right ear: Secondary | ICD-10-CM

## 2015-03-30 MED ORDER — PREDNISONE 10 MG (21) PO TBPK: 10.0000 mg | ORAL_TABLET | Freq: Every day | ORAL | Status: DC

## 2015-03-30 MED ORDER — FLUTICASONE PROPIONATE 50 MCG/ACT NA SUSP: 2.0000 | Freq: Every day | NASAL | Status: DC

## 2015-03-30 MED ORDER — MECLIZINE HCL 12.5 MG PO TABS: 12.5000 mg | ORAL_TABLET | Freq: Three times a day (TID) | ORAL | Status: DC | PRN

## 2015-03-30 MED ORDER — AMOXICILLIN 500 MG PO CAPS: 500.0000 mg | ORAL_CAPSULE | Freq: Three times a day (TID) | ORAL | Status: DC

## 2015-03-30 NOTE — Progress Notes (Signed)
Subjective:    Patient ID: Kellie Alexander, female    DOB: [birth year], 55 y.o.   MRN: [id number]  CC: Dizziness and Ear Pain   HPI: Kellie Alexander is a 55 y.o. female presenting for Dizziness and Ear Pain  Congestion starting a few days ago Two days ago when she woke up felt dizzy, the room was spinning Symptoms worse with turning her head This morning felt ok when she woke up R ear hurting Sometimes still feels slightly dizzy    Depression screen [facility] 2/9 01/03/2015 12/17/2014 12/07/2014  Decreased Interest 0 0 0  Down, Depressed, Hopeless 0 0 0  PHQ - 2 Score 0 0 0     Relevant past medical, surgical, family and social history reviewed and updated as indicated. Interim medical history since our last visit reviewed. Allergies and medications reviewed and updated.    ROS: Per HPI unless specifically indicated above  History  Smoking status  . Never Smoker   Smokeless tobacco  . Never Used    Past Medical History Patient Active Problem List   Diagnosis Date Noted  . Allergic rhinitis 12/18/2010  . GERD (gastroesophageal reflux disease) 12/18/2010  . Heme positive stool 12/18/2010    Current Outpatient Prescriptions  Medication Sig Dispense Refill  . amoxicillin (AMOXIL) 500 MG capsule Take 1 capsule (500 mg total) by mouth 3 (three) times daily. 14 capsule 0  . cetirizine (ZYRTEC) 10 MG tablet Take 10 mg by mouth daily.      . fluticasone (FLONASE) 50 MCG/ACT nasal spray Place 2 sprays into both nostrils daily. 16 g 6  . meclizine (ANTIVERT) 12.5 MG tablet Take 1 tablet (12.5 mg total) by mouth 3 (three) times daily as needed for dizziness. 30 tablet 0  . Multiple Vitamins-Minerals (MULTIVITAMIN PO) Take by mouth daily.    Marland Kitchen omeprazole (PRILOSEC) 20 MG capsule Take 20 mg by mouth daily.      . predniSONE (STERAPRED UNI-PAK 21 TAB) 10 MG (21) TBPK tablet Take 1 tablet (10 mg total) by mouth daily. Take daily as directed 21 tablet 0   No current  facility-administered medications for this visit.       Objective:    BP 156/94 mmHg  Pulse 106  Temp(Src) 98.1 F (36.7 C) (Oral)  Ht 5\' 2"  (1.575 m)  Wt 225 lb (102.059 kg)  BMI 41.14 kg/m2  Wt Readings from Last 3 Encounters:  03/30/15 225 lb (102.059 kg)  01/03/15 223 lb (101.152 kg)  12/27/14 224 lb (101.606 kg)     Gen: NAD, alert, cooperative with exam, NCAT EYES: EOMI, no scleral injection or icterus ENT:  TMs pearly gray b/l, OP without erythema LYMPH: no cervical LAD CV: NRRR, normal S1/S2, no murmur, distal pulses 2+ b/l Resp: CTABL, no wheezes, normal WOB Abd: +BS, soft, NTND. no guarding or organomegaly Ext: No edema, warm Neuro: Alert and oriented, strength equal b/l UE and LE, coordination grossly normal, one beat nystagmus at times with R-ward gaze. Sensation intact b/l.   MSK: normal muscle bulk     Assessment & Plan:    Chauncey was seen today for dizziness and ear pain.  Diagnoses and all orders for this visit:  Right acute serous otitis media, recurrence not specified -     amoxicillin (AMOXIL) 500 MG capsule; Take 1 capsule (500 mg total) by mouth 3 (three) times daily. -     fluticasone (FLONASE) 50 MCG/ACT nasal spray; Place 2 sprays into both nostrils daily.  Vestibular neuritis, right -     meclizine (ANTIVERT) 12.5 MG tablet; Take 1 tablet (12.5 mg total) by mouth 3 (three) times daily as needed for dizziness. -     predniSONE (STERAPRED UNI-PAK 21 TAB) 10 MG (21) TBPK tablet; Take 1 tablet (10 mg total) by mouth daily. Take daily as directed    Follow up plan: As needed if symptoms dont improve  Assunta Found, MD [facility] 03/30/2015, 7:20 PM

## 2015-04-06 ENCOUNTER — Ambulatory Visit (INDEPENDENT_AMBULATORY_CARE_PROVIDER_SITE_OTHER): Payer: BC Managed Care – PPO | Admitting: Family Medicine

## 2015-04-06 ENCOUNTER — Encounter: Payer: Self-pay | Admitting: Family Medicine

## 2015-04-06 VITALS — BP 146/91 | HR 109 | Temp 97.9°F | Ht 62.0 in | Wt 222.6 lb

## 2015-04-06 DIAGNOSIS — J189 Pneumonia, unspecified organism: Secondary | ICD-10-CM | POA: Diagnosis not present

## 2015-04-06 DIAGNOSIS — R0981 Nasal congestion: Secondary | ICD-10-CM

## 2015-04-06 DIAGNOSIS — R6883 Chills (without fever): Secondary | ICD-10-CM | POA: Diagnosis not present

## 2015-04-06 DIAGNOSIS — R509 Fever, unspecified: Secondary | ICD-10-CM

## 2015-04-06 DIAGNOSIS — R52 Pain, unspecified: Secondary | ICD-10-CM

## 2015-04-06 LAB — POCT INFLUENZA A/B
INFLUENZA A, POC: NEGATIVE
Influenza B, POC: NEGATIVE

## 2015-04-06 MED ORDER — CEFDINIR 300 MG PO CAPS: 300.0000 mg | ORAL_CAPSULE | Freq: Two times a day (BID) | ORAL | Status: DC

## 2015-04-06 MED ORDER — HYDROCODONE-HOMATROPINE 5-1.5 MG/5ML PO SYRP: 5.0000 mL | ORAL_SOLUTION | Freq: Four times a day (QID) | ORAL | Status: DC | PRN

## 2015-04-06 NOTE — Patient Instructions (Signed)
Great to meet you!  I am sending an antibiotic, omnicef, to the pharmacy. Be sure to finish them all.    Eat yogurt once a day while you are on the medicine.  Try the Epley's maneuvers  Community-Acquired Pneumonia, Adult Pneumonia is an infection of the lungs. There are different types of pneumonia. One type can develop while a person is in a hospital. A different type, called community-acquired pneumonia, develops in people who are not, or have not recently been, in the hospital or other health care facility.  CAUSES Pneumonia may be caused by bacteria, viruses, or funguses. Community-acquired pneumonia is often caused by Streptococcus pneumonia bacteria. These bacteria are often passed from one person to another by breathing in droplets from the cough or sneeze of an infected person. RISK FACTORS The condition is more likely to develop in:  People who havechronic diseases, such as chronic obstructive pulmonary disease (COPD), asthma, congestive heart failure, cystic fibrosis, diabetes, or kidney disease.  People who haveearly-stage or late-stage HIV.  People who havesickle cell disease.  People who havehad their spleen removed (splenectomy).  People who havepoor Human resources officer.  People who havemedical conditions that increase the risk of breathing in (aspirating) secretions their own mouth and nose.   People who havea weakened immune system (immunocompromised).  People who smoke.  People whotravel to areas where pneumonia-causing germs commonly exist.  People whoare around animal habitats or animals that have pneumonia-causing germs, including birds, bats, rabbits, cats, and farm animals. SYMPTOMS Symptoms of this condition include:  Adry cough.  A wet (productive) cough.  Fever.  Sweating.  Chest pain, especially when breathing deeply or coughing.  Rapid breathing or difficulty breathing.  Shortness of breath.  Shaking chills.  Fatigue.  Muscle  aches. DIAGNOSIS Your health care provider will take a medical history and perform a physical exam. You may also have other tests, including:  Imaging studies of your chest, including X-rays.  Tests to check your blood oxygen level and other blood gases.  Other tests on blood, mucus (sputum), fluid around your lungs (pleural fluid), and urine. If your pneumonia is severe, other tests may be done to identify the specific cause of your illness. TREATMENT The type of treatment that you receive depends on many factors, such as the cause of your pneumonia, the medicines you take, and other medical conditions that you have. For most adults, treatment and recovery from pneumonia may occur at home. In some cases, treatment must happen in a hospital. Treatment may include:  Antibiotic medicines, if the pneumonia was caused by bacteria.  Antiviral medicines, if the pneumonia was caused by a virus.  Medicines that are given by mouth or through an IV tube.  Oxygen.  Respiratory therapy. Although rare, treating severe pneumonia may include:  Mechanical ventilation. This is done if you are not breathing well on your own and you cannot maintain a safe blood oxygen level.  Thoracentesis. This procedureremoves fluid around one lung or both lungs to help you breathe better. HOME CARE INSTRUCTIONS  Take over-the-counter and prescription medicines only as told by your health care provider.  Only takecough medicine if you are losing sleep. Understand that cough medicine can prevent your body's natural ability to remove mucus from your lungs.  If you were prescribed an antibiotic medicine, take it as told by your health care provider. Do not stop taking the antibiotic even if you start to feel better.  Sleep in a semi-upright position at night. Try sleeping in a  reclining chair, or place a few pillows under your head.  Do not use tobacco products, including cigarettes, chewing tobacco, and  e-cigarettes. If you need help quitting, ask your health care provider.  Drink enough water to keep your urine clear or pale yellow. This will help to thin out mucus secretions in your lungs. PREVENTION There are ways that you can decrease your risk of developing community-acquired pneumonia. Consider getting a pneumococcal vaccine if:  You are older than 55 years of age.  You are older than 55 years of age and are undergoing cancer treatment, have chronic lung disease, or have other medical conditions that affect your immune system. Ask your health care provider if this applies to you. There are different types and schedules of pneumococcal vaccines. Ask your health care provider which vaccination option is best for you. You may also prevent community-acquired pneumonia if you take these actions:  Get an influenza vaccine every year. Ask your health care provider which type of influenza vaccine is best for you.  Go to the dentist on a regular basis.  Wash your hands often. Use hand sanitizer if soap and water are not available. SEEK MEDICAL CARE IF:  You have a fever.  You are losing sleep because you cannot control your cough with cough medicine. SEEK IMMEDIATE MEDICAL CARE IF:  You have worsening shortness of breath.  You have increased chest pain.  Your sickness becomes worse, especially if you are an older adult or have a weakened immune system.  You cough up blood.   This information is not intended to replace advice given to you by your health care provider. Make sure you discuss any questions you have with your health care provider.   Document Released: 01/22/2005 Document Revised: 10/13/2014 Document Reviewed: 05/19/2014 Elsevier Interactive Patient Education [employer].

## 2015-04-06 NOTE — Progress Notes (Signed)
   HPI  Patient presents today for acute illness.  Patient has continued dizziness, although it's improved, it does happen still occasionally but is less severe than her other symptoms now. She continues to have right ear pain, cough, congestion, fever which was over 100 on Sunday, but now resolved, chills, body aches, and dizziness.  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 She states that she's tolerating food and fluids easily. She has mild dyspnea Her cough gets worse at night.  PMH: Smoking status noted ROS: Per HPI  Objective: BP 146/91 mmHg  Pulse 109  Temp(Src) 97.9 F (36.6 C) (Oral)  Ht 5\' 2"  (1.575 m)  Wt 222 lb 9.6 oz (100.971 kg)  BMI 40.70 kg/m2 Gen: NAD, alert, cooperative with exam HEENT: NCAT, TM normal bilaterally with some fluid present behind the right TM although no erythema or swelling. CV: RRR, good S1/S2, no murmur Resp: non-labored, persistent rhonchus in the right upper lung field Ext: No edema, warm Neuro: Alert and oriented, No gross deficits  Assessment and plan:  # CAP Rapid flu negative, prolonged illness with persistent rhonchi in the lungs Amoxil last week so I have escalated past augmentin to omnicef Supportive care discussed, hycodan for nightime cough RTC with any additional concerns  Agree some component of vestibular neuritis, also discussed epley's maneuvers    Orders Placed This Encounter  Procedures  . POCT Influenza A/B    Meds ordered this encounter  Medications  . cefdinir (OMNICEF) 300 MG capsule    Sig: Take 1 capsule (300 mg total) by mouth 2 (two) times daily. 1 po BID    Dispense:  20 capsule    Refill:  0  . HYDROcodone-homatropine (HYCODAN) 5-1.5 MG/5ML syrup    Sig: Take 5 mLs by mouth every 6 (six) hours as needed for cough.    Dispense:  120 mL    Refill:  [place], MD [facility] 04/06/2015, 2:34 PM

## 2015-04-11 ENCOUNTER — Ambulatory Visit (INDEPENDENT_AMBULATORY_CARE_PROVIDER_SITE_OTHER): Payer: BC Managed Care – PPO

## 2015-04-11 ENCOUNTER — Encounter: Payer: Self-pay | Admitting: *Deleted

## 2015-04-11 ENCOUNTER — Encounter: Payer: Self-pay | Admitting: Family Medicine

## 2015-04-11 ENCOUNTER — Ambulatory Visit (INDEPENDENT_AMBULATORY_CARE_PROVIDER_SITE_OTHER): Payer: BC Managed Care – PPO | Admitting: Family Medicine

## 2015-04-11 VITALS — BP 146/97 | HR 110 | Temp 97.2°F | Ht 62.0 in | Wt 223.0 lb

## 2015-04-11 DIAGNOSIS — R05 Cough: Secondary | ICD-10-CM

## 2015-04-11 DIAGNOSIS — R059 Cough, unspecified: Secondary | ICD-10-CM

## 2015-04-11 MED ORDER — LEVOFLOXACIN 500 MG PO TABS: 500.0000 mg | ORAL_TABLET | Freq: Every day | ORAL | Status: DC

## 2015-04-11 MED ORDER — METHYLPREDNISOLONE ACETATE 80 MG/ML IJ SUSP
80.0000 mg | Freq: Once | INTRAMUSCULAR | Status: AC
  Administered 2015-04-11: 80 mg via INTRAMUSCULAR

## 2015-04-11 NOTE — Progress Notes (Signed)
   Subjective:    Patient ID: Kellie Alexander, female    DOB: 11-May-1960, 55 y.o.   MRN: [id number]  HPI Patient here today for cough and congestion that started about 3 weeks ago. Vision has been treated with first amoxicillin, then Omnicef. Symptoms persist she has wheezing and cough and congestion. She is concerned about possibility of pneumonia.     Patient Active Problem List   Diagnosis Date Noted  . Allergic rhinitis 12/18/2010  . GERD (gastroesophageal reflux disease) 12/18/2010  . Heme positive stool 12/18/2010   Outpatient Encounter Prescriptions as of 04/11/2015  Medication Sig  . cefdinir (OMNICEF) 300 MG capsule Take 1 capsule (300 mg total) by mouth 2 (two) times daily. 1 po BID  . cetirizine (ZYRTEC) 10 MG tablet Take 10 mg by mouth daily.    . fluticasone (FLONASE) 50 MCG/ACT nasal spray Place 2 sprays into both nostrils daily.  Marland Kitchen HYDROcodone-homatropine (HYCODAN) 5-1.5 MG/5ML syrup Take 5 mLs by mouth every 6 (six) hours as needed for cough.  . meclizine (ANTIVERT) 12.5 MG tablet Take 1 tablet (12.5 mg total) by mouth 3 (three) times daily as needed for dizziness.  . Multiple Vitamins-Minerals (MULTIVITAMIN PO) Take by mouth daily.  Marland Kitchen omeprazole (PRILOSEC) 20 MG capsule Take 20 mg by mouth daily.     No facility-administered encounter medications on file as of 04/11/2015.      Review of Systems  Constitutional: Negative.  Negative for fever.  HENT: Positive for congestion and ear pain.   Eyes: Negative.   Respiratory: Positive for cough and wheezing.   Cardiovascular: Negative.   Gastrointestinal: Negative.   Endocrine: Negative.   Genitourinary: Negative.   Musculoskeletal: Negative.   Skin: Negative.   Allergic/Immunologic: Negative.   Neurological: Negative.   Hematological: Negative.   Psychiatric/Behavioral: Negative.        Objective:   Physical Exam  Constitutional: She is oriented to person, place, and time. She appears well-developed and  well-nourished.  HENT:  Head: Normocephalic.  Right Ear: External ear normal.  Left Ear: External ear normal.  Cardiovascular: Normal rate, regular rhythm and normal heart sounds.   Pulmonary/Chest: Effort normal and breath sounds normal.  Neurological: She is alert and oriented to person, place, and time.   BP 146/97 mmHg  Pulse 110  Temp(Src) 97.2 F (36.2 C) (Oral)  Ht 5\' 2"  (1.575 m)  Wt 223 lb (101.152 kg)  BMI 40.78 kg/m2  LMP 04/06/2014        Assessment & Plan:  1. Cough . Chest x-ray appears clear. We'll move to Levaquin 500 mg daily for 7 days and DC Omnicef. Also gave her sample inhaler with long-acting bronchodilator and steroid - DG Chest 2 View; Future - methylPREDNISolone acetate (DEPO-MEDROL) injection 80 mg; Inject 1 mL (80 mg total) into the muscle once.

## 2015-04-12 ENCOUNTER — Ambulatory Visit: Payer: BC Managed Care – PPO | Admitting: Family Medicine

## 2015-04-13 ENCOUNTER — Telehealth: Payer: Self-pay | Admitting: Family Medicine

## 2015-04-13 NOTE — Telephone Encounter (Signed)
Note was completed and placed at front desk, patient informed

## 2015-08-03 ENCOUNTER — Other Ambulatory Visit: Payer: Self-pay | Admitting: Obstetrics & Gynecology

## 2015-08-03 DIAGNOSIS — R928 Other abnormal and inconclusive findings on diagnostic imaging of breast: Secondary | ICD-10-CM

## 2015-08-10 ENCOUNTER — Ambulatory Visit
Admission: RE | Admit: 2015-08-10 | Discharge: 2015-08-10 | Disposition: A | Discharge status: Home / Self Care | Payer: BC Managed Care – PPO | Source: Ambulatory Visit | Attending: Obstetrics & Gynecology | Admitting: Obstetrics & Gynecology

## 2015-08-10 ENCOUNTER — Other Ambulatory Visit: Payer: Self-pay | Admitting: Obstetrics & Gynecology

## 2015-08-10 DIAGNOSIS — R928 Other abnormal and inconclusive findings on diagnostic imaging of breast: Secondary | ICD-10-CM

## 2015-08-16 ENCOUNTER — Ambulatory Visit
Admission: RE | Admit: 2015-08-16 | Discharge: 2015-08-16 | Disposition: A | Discharge status: Home / Self Care | Payer: BC Managed Care – PPO | Source: Ambulatory Visit | Attending: Obstetrics & Gynecology | Admitting: Obstetrics & Gynecology

## 2015-08-16 ENCOUNTER — Other Ambulatory Visit: Payer: Self-pay | Admitting: Obstetrics & Gynecology

## 2015-08-16 DIAGNOSIS — R928 Other abnormal and inconclusive findings on diagnostic imaging of breast: Secondary | ICD-10-CM

## 2016-01-09 ENCOUNTER — Encounter: Payer: Self-pay | Admitting: Family Medicine

## 2016-01-09 ENCOUNTER — Ambulatory Visit (INDEPENDENT_AMBULATORY_CARE_PROVIDER_SITE_OTHER): Payer: BC Managed Care – PPO

## 2016-01-09 ENCOUNTER — Ambulatory Visit (INDEPENDENT_AMBULATORY_CARE_PROVIDER_SITE_OTHER): Payer: BC Managed Care – PPO | Admitting: Family Medicine

## 2016-01-09 VITALS — BP 151/84 | HR 110 | Temp 97.3°F | Ht 62.0 in | Wt 224.0 lb

## 2016-01-09 DIAGNOSIS — J209 Acute bronchitis, unspecified: Secondary | ICD-10-CM

## 2016-01-09 MED ORDER — ALBUTEROL SULFATE (2.5 MG/3ML) 0.083% IN NEBU
2.5000 mg | INHALATION_SOLUTION | Freq: Once | RESPIRATORY_TRACT | Status: AC
  Administered 2016-01-09: 2.5 mg via RESPIRATORY_TRACT

## 2016-01-09 MED ORDER — ALBUTEROL SULFATE HFA 108 (90 BASE) MCG/ACT IN AERS: 2.0000 | INHALATION_SPRAY | Freq: Four times a day (QID) | RESPIRATORY_TRACT | 0 refills | Status: AC | PRN

## 2016-01-09 MED ORDER — METHYLPREDNISOLONE ACETATE 80 MG/ML IJ SUSP
80.0000 mg | Freq: Once | INTRAMUSCULAR | Status: AC
  Administered 2016-01-09: 80 mg via INTRAMUSCULAR

## 2016-01-09 MED ORDER — HYDROCODONE-HOMATROPINE 5-1.5 MG/5ML PO SYRP: 5.0000 mL | ORAL_SOLUTION | Freq: Four times a day (QID) | ORAL | 0 refills | Status: AC | PRN

## 2016-01-09 MED ORDER — LEVOFLOXACIN 500 MG PO TABS: 500.0000 mg | ORAL_TABLET | Freq: Every day | ORAL | 0 refills | Status: AC

## 2016-01-09 NOTE — Patient Instructions (Signed)
Great to see you!  The shot should help breathing and cough, Levaquin is a strong antibiotic that can treat pneumonia   Try the inhaler up to every 6 hours as needed for cough and shortness of breath  DO not drive after taking hycodan

## 2016-01-09 NOTE — Progress Notes (Signed)
   HPI  Patient presents today here with cough.  Patient states over last 2 weeks she's had cough, congestion, wheezing, and headache. She's had off and on elevated temperature ranging 99-100. She has not measured a temperature higher than 100  She's tried over-the-counter cough medicines including Mucinex and Alka-Seltzer cold, these of helped minimally.  She has some mild shortness of breath, however coughing fits happen and are so severe that they take her breath away. She's tolerating foods and fluids normally.  He had a previous episode similar to this which was very difficult to resolve using Omnicef, Levaquin, and then a breo inhaler   PMH: Smoking status noted ROS: Per HPI  Objective: BP (!) 151/84   Pulse (!) 110   Temp 97.3 F (36.3 C) (Oral)   Ht 5\' 2"  (1.575 m)   Wt 224 lb (101.6 kg)   BMI 40.97 kg/m  Gen: NAD, alert, cooperative with exam HEENT: NCAT, EOMI, PERRL CV: Good air movement, coarse breath sounds throughout, expiratory wheezes Resp: CTABL, no wheezes, non-labored Ext: No edema, warm Neuro: Alert and oriented, No gross deficits  After nebulizer respirations have increased air movement with louder wheezes, still some coarse breath sounds scattered  CXR- Clear  Assessment and plan:  # Acute bronchitis With 2 weeks of illness and subjective fever I am treating with IM Depo-Medrol, Levaquin, albuterol, and hycodan for night time cough CXR- clear. Antibitics due to duration and severity of disease Suspect underlying lung disease, consider PFTs when well.  RTC with any concerns      Orders Placed This Encounter  Procedures  . DG Chest 2 View    Standing Status:   Future    Number of Occurrences:   1    Standing Expiration Date:   03/11/2017    Order Specific Question:   Reason for Exam (SYMPTOM  OR DIAGNOSIS REQUIRED)    Answer:   Cough, eval for CAP    Order Specific Question:   Is patient pregnant?    Answer:   No    Order Specific  Question:   Preferred imaging location?    Answer:   Internal    Meds ordered this encounter  Medications  . levofloxacin (LEVAQUIN) 500 MG tablet    Sig: Take 1 tablet (500 mg total) by mouth daily.    Dispense:  7 tablet    Refill:  0  . albuterol (PROVENTIL HFA;VENTOLIN HFA) 108 (90 Base) MCG/ACT inhaler    Sig: Inhale 2 puffs into the lungs every 6 (six) hours as needed for wheezing or shortness of breath.    Dispense:  1 Inhaler    Refill:  0  . HYDROcodone-homatropine (HYCODAN) 5-1.5 MG/5ML syrup    Sig: Take 5 mLs by mouth every 6 (six) hours as needed for cough.    Dispense:  120 mL    Refill:  0  . methylPREDNISolone acetate (DEPO-MEDROL) injection 80 mg  . albuterol (PROVENTIL) (2.5 MG/3ML) 0.083% nebulizer solution 2.5 mg    Laroy Apple, MD [facility] 01/09/2016, 5:09 PM

## 2016-03-16 IMAGING — CR DG CHEST 2V
2 series · 2 of 2 positions shown · non-contrast
Comparison: None.

CLINICAL DATA: Cough and congestion

EXAM:
CHEST  2 VIEW

[view not recorded (1 of 2)]
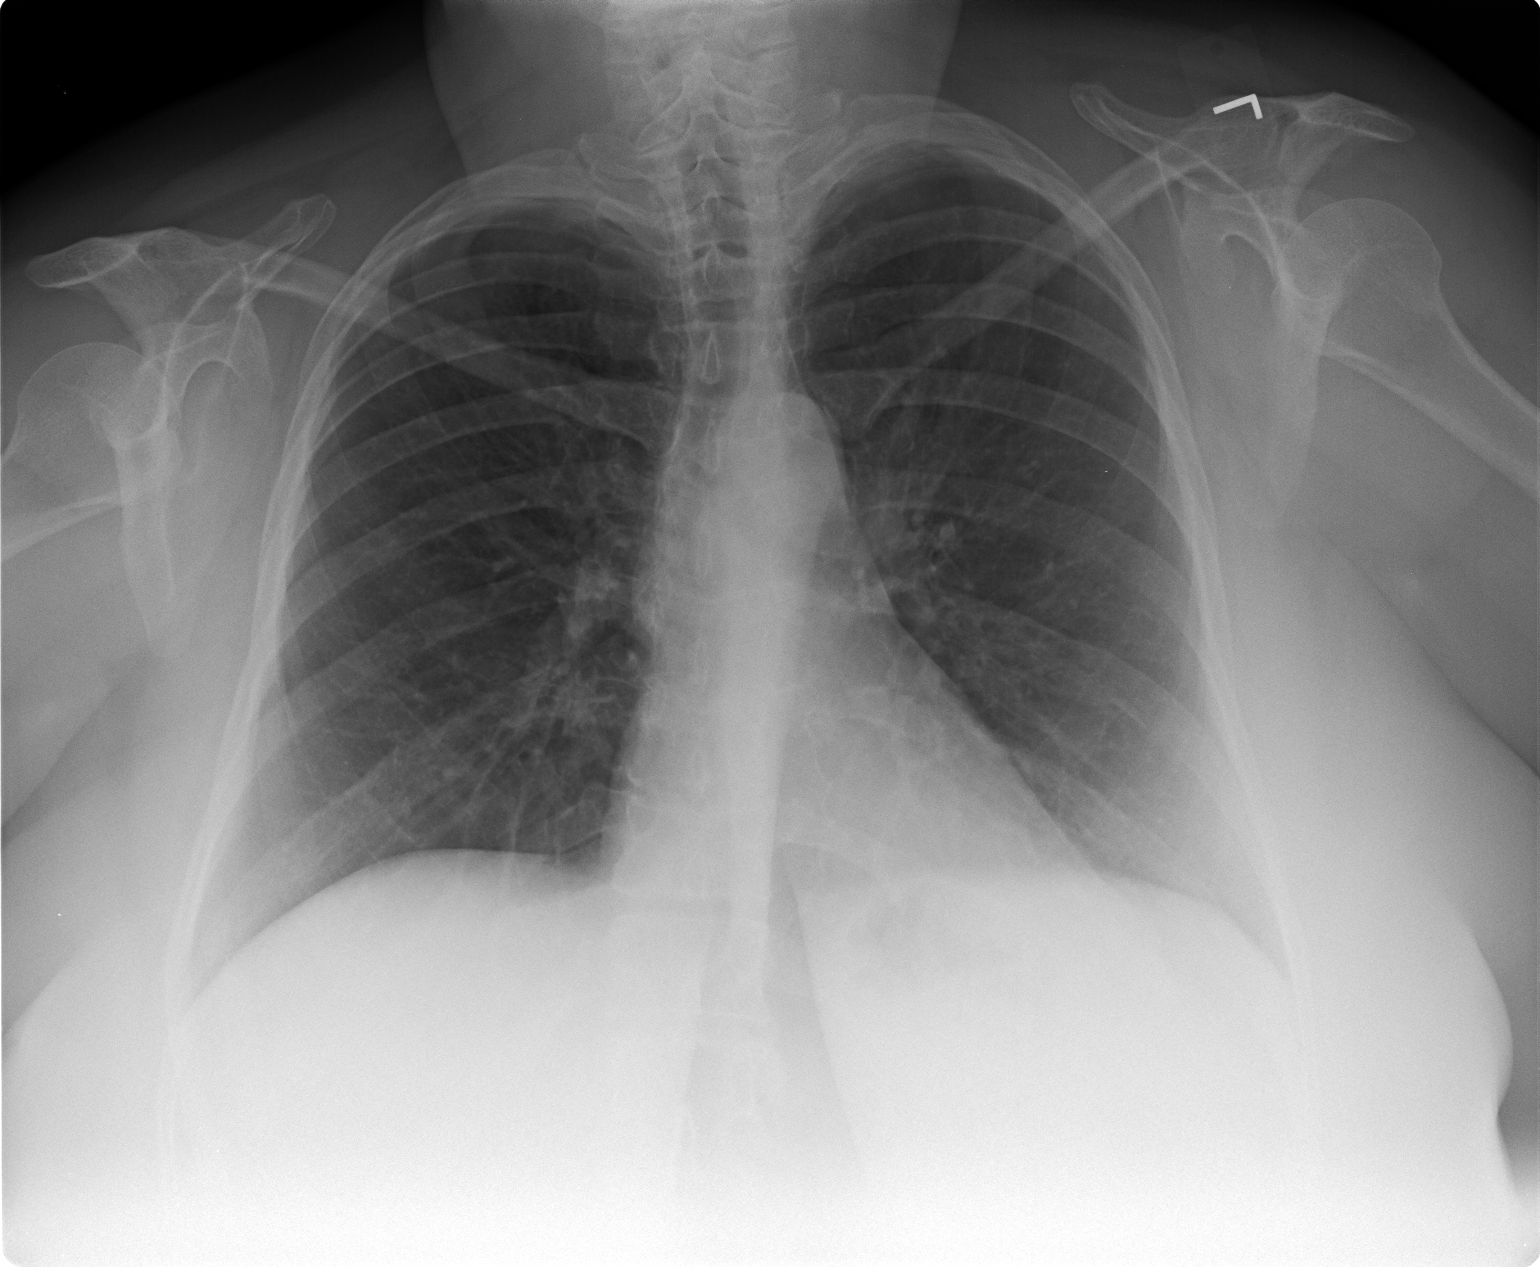

[view not recorded (2 of 2)]
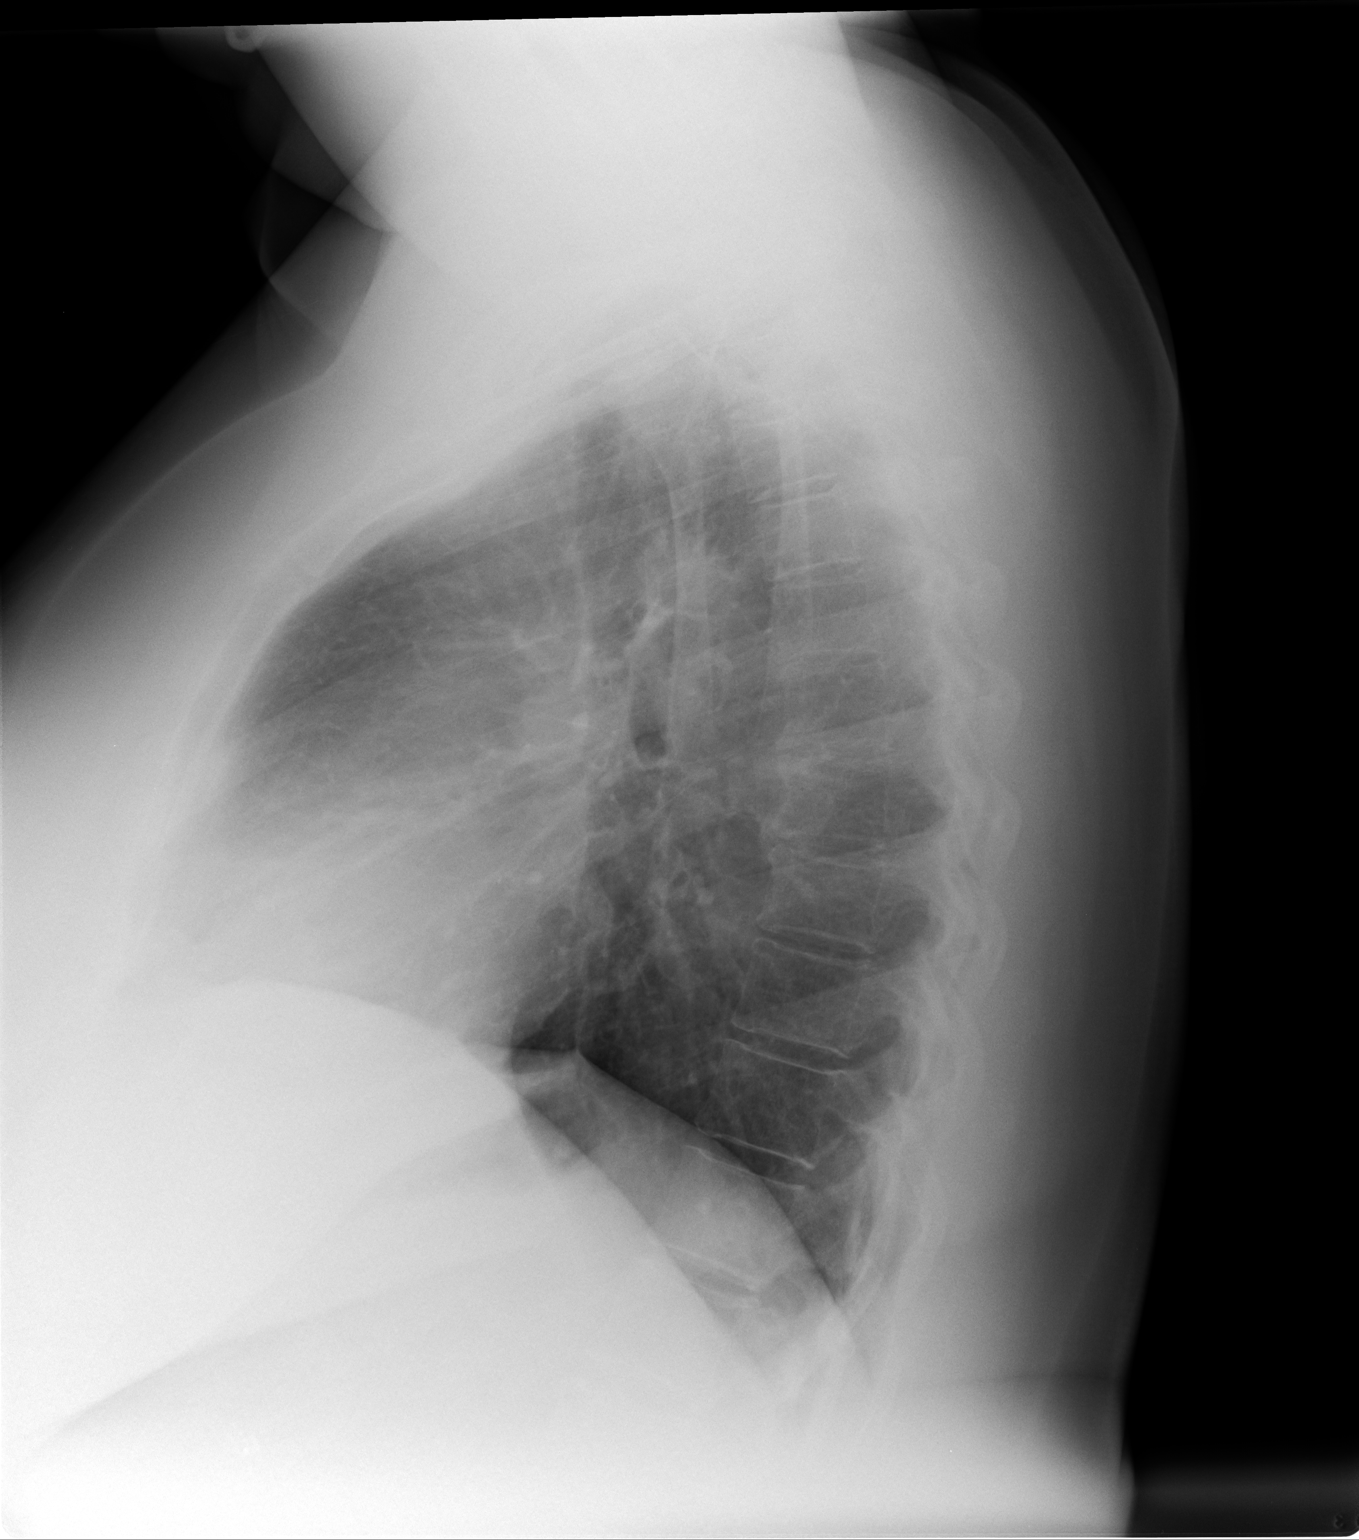

[2 of 2 positions shown; findings below may reference images not displayed]

FINDINGS: The heart size and mediastinal contours are within normal limits.
Both lungs are clear. The visualized skeletal structures are
unremarkable.
IMPRESSION: No active cardiopulmonary disease.

## 2016-12-14 IMAGING — DX DG CHEST 2V
2 series · 2 of 2 positions shown · non-contrast
Comparison: 04/11/2015.

CLINICAL DATA: Fever and cough.

EXAM:
CHEST  2 VIEW

[chest pa]
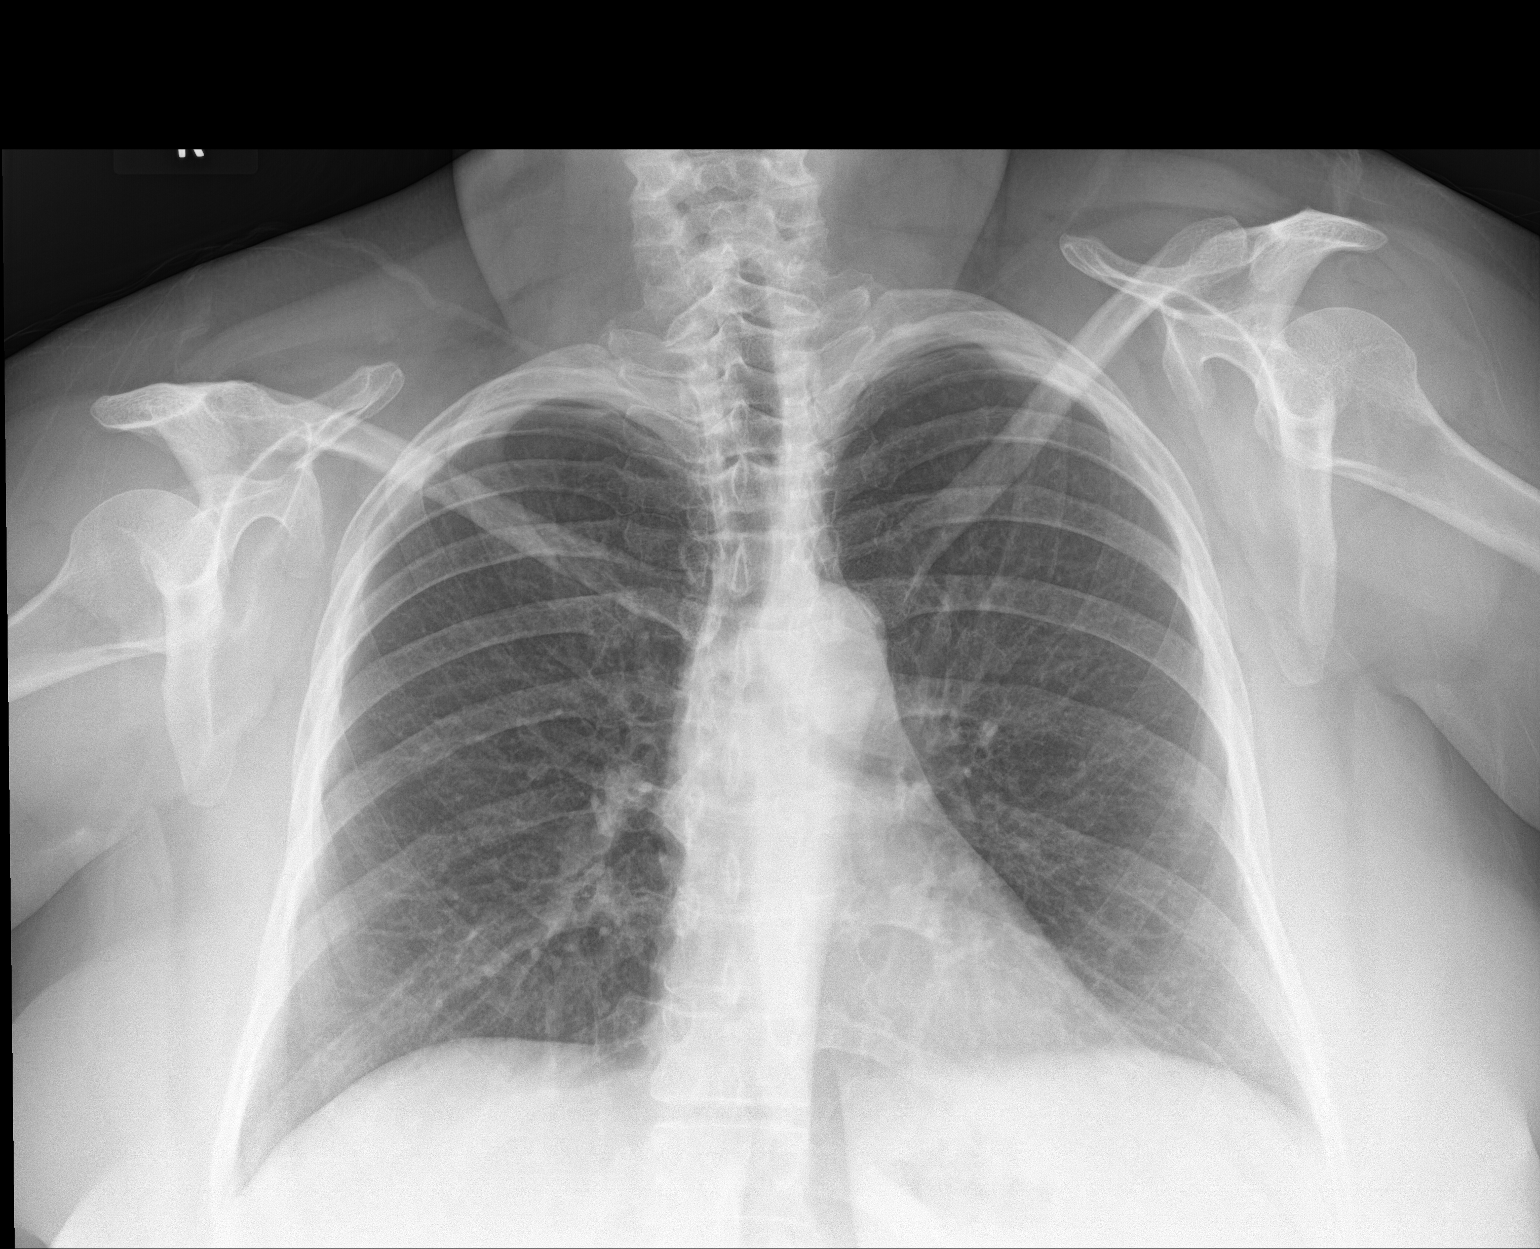

[chest lat]
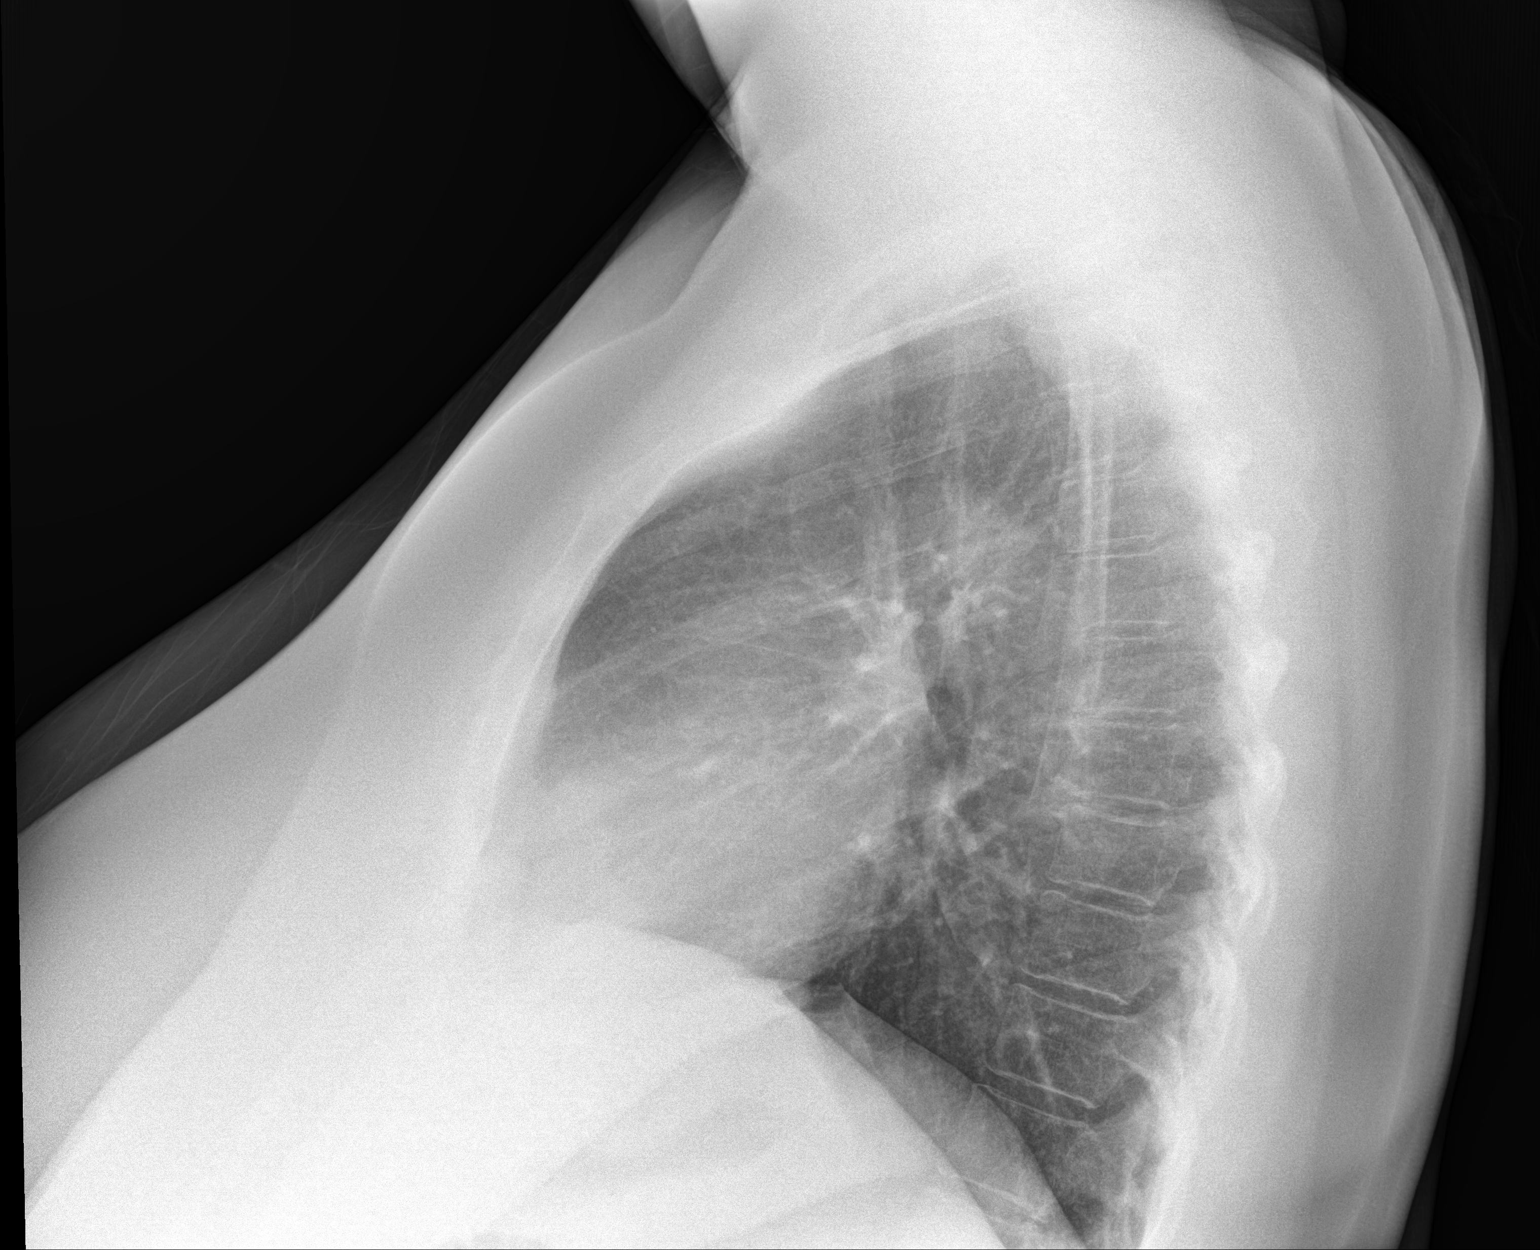

[2 of 2 positions shown; findings below may reference images not displayed]

FINDINGS: Mediastinum and hilar structures normal. Heart size normal. Mild
lingular infiltrate cannot be completely excluded. No pleural
effusion or pneumothorax. Posterior costophrenic angles not imaged
on lateral view. No acute bony abnormality .
IMPRESSION: Mild lingular infiltrate cannot be completely excluded .

## 2018-07-25 ENCOUNTER — Telehealth: Payer: Self-pay | Admitting: Family

## 2019-11-04 ENCOUNTER — Other Ambulatory Visit: Payer: Self-pay

## 2019-11-04 ENCOUNTER — Ambulatory Visit: Payer: BC Managed Care – PPO | Admitting: Family Medicine

## 2022-10-03 ENCOUNTER — Encounter: Payer: Self-pay | Admitting: Physician Assistant

## 2023-08-21 ENCOUNTER — Telehealth: Payer: Self-pay | Admitting: Internal Medicine

## 2023-08-21 NOTE — Telephone Encounter (Signed)
 Good afternoon Dr. Albertus, this patient was called due to her having a referral for a colonoscopy. Patient stated that she was recently diagnosed as a diabetic. Patient stated that she would like to have a morning appointment. I advised patient you did not have any availability for the morning. Would you please advise on how to schedule this patient.  Thank you.

## 2023-08-22 NOTE — Telephone Encounter (Signed)
See note below from Dr. Pyrtle 

## 2023-08-22 NOTE — Telephone Encounter (Signed)
 Pt can be added for screening colon in [facility] once Oct sch comes out which should be in the next 1-2 days
